# Patient Record
Sex: Male | Born: 1975 | Race: White | Hispanic: No | Marital: Married | Smoking: Never smoker
Health system: Southern US, Community
[De-identification: ages and names within clinical notes are randomized; demographics above are authoritative.]

---

## 2016-03-20 ENCOUNTER — Emergency Department (INDEPENDENT_AMBULATORY_CARE_PROVIDER_SITE_OTHER)
Admission: EM | Admit: 2016-03-20 | Discharge: 2016-03-20 | Disposition: A | Discharge status: Home / Self Care | Payer: BLUE CROSS/BLUE SHIELD | Source: Home / Self Care | Attending: Family Medicine | Admitting: Family Medicine

## 2016-03-20 ENCOUNTER — Encounter: Payer: Self-pay | Admitting: Emergency Medicine

## 2016-03-20 ENCOUNTER — Emergency Department (HOSPITAL_BASED_OUTPATIENT_CLINIC_OR_DEPARTMENT_OTHER): Payer: BLUE CROSS/BLUE SHIELD

## 2016-03-20 ENCOUNTER — Emergency Department (INDEPENDENT_AMBULATORY_CARE_PROVIDER_SITE_OTHER): Payer: BLUE CROSS/BLUE SHIELD

## 2016-03-20 DIAGNOSIS — R053 Chronic cough: Secondary | ICD-10-CM

## 2016-03-20 DIAGNOSIS — R05 Cough: Secondary | ICD-10-CM

## 2016-03-20 DIAGNOSIS — R059 Cough, unspecified: Secondary | ICD-10-CM

## 2016-03-20 MED ORDER — AZITHROMYCIN 250 MG PO TABS: ORAL_TABLET | ORAL | 0 refills | Status: DC

## 2016-03-20 NOTE — ED Triage Notes (Addendum)
SOB, Headache, Slight cough, congestion. Patient moved here from [place] ago, while in [place] he opened 5 paint can which were covered in black mold, he was exposed for a few minutes and believes that is the cause. He started taking garlic and charcoal capsules and as soon as he stopped the symptoms returned last weekend.

## 2016-03-20 NOTE — ED Provider Notes (Signed)
[facility]    CSN: [id number] Arrival date & time: 03/20/16  1422     History   Chief Complaint Chief Complaint  Patient presents with  . Shortness of Breath    HPI Daniel Carlson is a 41 y.o. male.   Patient and family moved here from [place] one month ago.  While there, he remembers opening paint cans that were covered in black mold.  Shortly afterwards he developed sore throat, sinus congestion, and cough.  The cough has persisted, and during the past 5 days he has had increased shortness of breath, headache, and left earache.  No fevers, chills, and sweats.  No wheezing.  No past history of asthma.  He is concerned about his past exposure to black mold.   The history is provided by the patient.    History reviewed. No pertinent past medical history.  There are no active problems to display for this patient.   History reviewed. No pertinent surgical history.     Home Medications    Prior to Admission medications   Medication Sig Start Date End Date Taking? Authorizing Provider  azithromycin (ZITHROMAX Z-PAK) 250 MG tablet Take 2 tabs today; then begin one tab once daily for 4 more days. 03/20/16   Lattie Haw, MD    Family History Family History  Problem Relation Age of Onset  . Cancer Mother   . Hyperlipidemia Mother   . Cancer Father   . Glaucoma Father     Social History Social History  Substance Use Topics  . Smoking status: Never Smoker  . Smokeless tobacco: Never Used  . Alcohol use Yes     Allergies   Patient has no known allergies.   Review of Systems Review of Systems  + sore throat, resolved + cough No pleuritic pain No wheezing + nasal congestion + post-nasal drainage No sinus pain/pressure No itchy/red eyes ? earache No hemoptysis + SOB No fever/chills No nausea No vomiting No abdominal pain No diarrhea No urinary symptoms No skin rash + fatigue No myalgias No headache Used OTC meds without relief      Physical Exam Triage Vital Signs ED Triage Vitals  Enc Vitals Group     BP 03/20/16 1450 128/84     Pulse Rate 03/20/16 1450 62     Resp --      Temp 03/20/16 1450 97.8 F (36.6 C)     Temp Source 03/20/16 1450 Oral     SpO2 03/20/16 1450 99 %     Weight 03/20/16 1453 198 lb (89.8 kg)     Height 03/20/16 1453 6' (1.829 m)     Head Circumference --      Peak Flow --      Pain Score 03/20/16 1455 2     Pain Loc --      Pain Edu? --      Excl. in GC? --    No data found.   Updated Vital Signs BP 128/84 (BP Location: Left Arm)   Pulse 62   Temp 97.8 F (36.6 C) (Oral)   Ht 6' (1.829 m)   Wt 198 lb (89.8 kg)   SpO2 99%   BMI 26.85 kg/m   Visual Acuity Right Eye Distance:   Left Eye Distance:   Bilateral Distance:    Right Eye Near:   Left Eye Near:    Bilateral Near:     Physical Exam Nursing notes and Vital Signs reviewed. Appearance:  Patient appears stated  age, and in no acute distress Eyes:  Pupils are equal, round, and reactive to light and accomodation.  Extraocular movement is intact.  Conjunctivae are not inflamed  Ears:  Canals normal.  Tympanic membranes normal.  Nose:  Mildly congested turbinates.  No sinus tenderness.     Pharynx:  Normal Neck:  Supple.  Tender enlarged posterior/lateral nodes are palpated bilaterally  Lungs:  Clear to auscultation.  Breath sounds are equal.  Moving air well. Heart:  Regular rate and rhythm without murmurs, rubs, or gallops.  Abdomen:  Nontender without masses or hepatosplenomegaly.  Bowel sounds are present.  No CVA or flank tenderness.  Extremities:  No edema.  Skin:  No rash present.    UC Treatments / Results  Labs (all labs ordered are listed, but only abnormal results are displayed) Labs Reviewed - No data to display  EKG  EKG Interpretation None       Radiology Dg Chest 2 View  Result Date: 03/20/2016 CLINICAL DATA:  Persistent cough x1 month EXAM: CHEST  2 VIEW COMPARISON:  None. FINDINGS:  Lungs are clear.  No pleural effusion or pneumothorax. The heart is normal in size. Visualized osseous structures are within normal limits. IMPRESSION: Normal chest radiographs. Electronically Signed   By: Charline BillsSriyesh  Krishnan M.D.   On: 03/20/2016 15:47    Procedures Procedures (including critical care time)  Medications Ordered in UC Medications - No data to display   Initial Impression / Assessment and Plan / UC Course  I have reviewed the triage vital signs and the nursing notes.  Pertinent labs & imaging results that were available during my care of the patient were reviewed by me and considered in my medical decision making (see chart for details).    Negative chest x-ray reassuring. Begin empiric Z-pak for atypical coverage. Take plain guaifenesin (1200mg  extended release tabs such as Mucinex) twice daily, with plenty of water, for cough and congestion.  May add Pseudoephedrine (30mg , one or two every 4 to 6 hours) for sinus congestion.  Get adequate rest.   Stop all antihistamines for now, and other non-prescription cough/cold preparations. May take Delsym Cough Suppressant at bedtime for nighttime cough.  Followup with pulmonologist if not improving after treatment.    Final Clinical Impressions(s) / UC Diagnoses   Final diagnoses:  Persistent cough for 3 weeks or longer    New Prescriptions New Prescriptions   AZITHROMYCIN (ZITHROMAX Z-PAK) 250 MG TABLET    Take 2 tabs today; then begin one tab once daily for 4 more days.     Lattie HawStephen A Cristian Davitt, MD 03/20/16 [phone]

## 2016-03-20 NOTE — Discharge Instructions (Signed)
Take plain guaifenesin (1200mg  extended release tabs such as Mucinex) twice daily, with plenty of water, for cough and congestion.  May add Pseudoephedrine (30mg , one or two every 4 to 6 hours) for sinus congestion.  Get adequate rest.   Stop all antihistamines for now, and other non-prescription cough/cold preparations. May take Delsym Cough Suppressant at bedtime for nighttime cough.

## 2017-03-24 ENCOUNTER — Other Ambulatory Visit: Payer: Self-pay

## 2017-03-24 ENCOUNTER — Emergency Department (INDEPENDENT_AMBULATORY_CARE_PROVIDER_SITE_OTHER)
Admission: EM | Admit: 2017-03-24 | Discharge: 2017-03-24 | Disposition: A | Discharge status: Home / Self Care | Payer: BLUE CROSS/BLUE SHIELD | Source: Home / Self Care | Attending: Family Medicine | Admitting: Family Medicine

## 2017-03-24 ENCOUNTER — Emergency Department (INDEPENDENT_AMBULATORY_CARE_PROVIDER_SITE_OTHER): Payer: BLUE CROSS/BLUE SHIELD

## 2017-03-24 DIAGNOSIS — M542 Cervicalgia: Secondary | ICD-10-CM

## 2017-03-24 DIAGNOSIS — H6192 Disorder of left external ear, unspecified: Secondary | ICD-10-CM

## 2017-03-24 MED ORDER — PREDNISONE 20 MG PO TABS: ORAL_TABLET | ORAL | 0 refills | Status: DC

## 2017-03-24 NOTE — ED Provider Notes (Signed)
[facility]    CSN: [id number] Arrival date & time: 03/24/17  1540     History   Chief Complaint Chief Complaint  Patient presents with  . Neck Pain    HPI Daniel Carlson is a 42 y.o. male.   Patient complains of pain in his left neck and trapezius area for about 10 days.  He recalls that he fell down stairs about a month ago but did not have any neck pain at that time. He also complains of a rough spot on his external right ear that is somewhat painful at times and often crusts over.  The history is provided by the patient.  Neck Injury  The current episode started more than 1 week ago. The problem occurs constantly. The problem has been gradually worsening. Pertinent negatives include no chest pain, no abdominal pain and no headaches. The symptoms are aggravated by walking (rotating neck). Nothing relieves the symptoms. Treatments tried: Ibuprofen and tramadol. The treatment provided mild relief.    History reviewed. No pertinent past medical history.  There are no active problems to display for this patient.   History reviewed. No pertinent surgical history.     Home Medications    Prior to Admission medications   Medication Sig Start Date End Date Taking? Authorizing Provider  predniSONE (DELTASONE) 20 MG tablet Take one tab by mouth twice daily for 4 days, then one daily. Take with food. 03/24/17   Lattie Haw, MD    Family History Family History  Problem Relation Age of Onset  . Cancer Mother   . Hyperlipidemia Mother   . Cancer Father   . Glaucoma Father     Social History Social History   Tobacco Use  . Smoking status: Never Smoker  . Smokeless tobacco: Never Used  Substance Use Topics  . Alcohol use: Yes    Alcohol/week: 0.6 - 1.2 oz    Types: 1 - 2 Cans of beer per week  . Drug use: No     Allergies   Patient has no known allergies.   Review of Systems Review of Systems  Respiratory: Negative.   Cardiovascular:  Negative for chest pain.  Gastrointestinal: Negative for abdominal pain.  Musculoskeletal: Negative for arthralgias.  Skin:       Left ear lesion  Neurological: Negative for headaches.  All other systems reviewed and are negative.    Physical Exam Triage Vital Signs ED Triage Vitals  Enc Vitals Group     BP 03/24/17 1616 129/85     Pulse Rate 03/24/17 1616 63     Resp 03/24/17 1616 18     Temp 03/24/17 1616 98.4 F (36.9 C)     Temp Source 03/24/17 1616 Oral     SpO2 03/24/17 1616 100 %     Weight 03/24/17 1618 205 lb (93 kg)     Height 03/24/17 1618 6' (1.829 m)     Head Circumference --      Peak Flow --      Pain Score 03/24/17 1617 7     Pain Loc --      Pain Edu? --      Excl. in GC? --    No data found.  Updated Vital Signs BP 129/85 (BP Location: Right Arm)   Pulse 63   Temp 98.4 F (36.9 C) (Oral)   Resp 18   Ht 6' (1.829 m)   Wt 205 lb (93 kg)   SpO2 100%   BMI  27.80 kg/m   Visual Acuity Right Eye Distance:   Left Eye Distance:   Bilateral Distance:    Right Eye Near:   Left Eye Near:    Bilateral Near:     Physical Exam  Constitutional: He appears well-developed and well-nourished. No distress.  HENT:  Head: Normocephalic.  Right Ear: External ear normal.  Ears:  Nose: Nose normal.  Mouth/Throat: Oropharynx is clear and moist.  Left superior helix has has tenderness as noted on diagram.  Minimal erythema but no induration of fluctuance.  Eyes: Pupils are equal, round, and reactive to light.  Neck: Normal range of motion. Neck supple. No muscular tenderness present.    Pain in left neck and trapezius area as noted on diagram, without tenderness to palpation. No adenopathy  Cardiovascular: Normal heart sounds.  Pulmonary/Chest: Breath sounds normal.  Abdominal: Bowel sounds are normal. There is no tenderness.  Musculoskeletal: He exhibits no edema.  Skin: Skin is warm and dry. No rash noted.  Nursing note and vitals reviewed.    UC  Treatments / Results  Labs (all labs ordered are listed, but only abnormal results are displayed) Labs Reviewed - No data to display  EKG  EKG Interpretation None       Radiology Dg Cervical Spine Complete  Result Date: 03/24/2017 CLINICAL DATA:  LEFT neck pain and trapezius area pain for 10 days EXAM: CERVICAL SPINE - COMPLETE 4+ VIEW COMPARISON:  None FINDINGS: Prevertebral soft tissues normal thickness. Osseous mineralization normal. Vertebral body and disc space heights maintained. No acute fracture, subluxation, or bone destruction. Lung apices clear. Bony foramina grossly patent. C1-C2 alignment normal. IMPRESSION: No acute osseous abnormalities. Electronically Signed   By: Ulyses SouthwardMark  Boles M.D.   On: 03/24/2017 18:02    Procedures Procedures (including critical care time)  Medications Ordered in UC Medications - No data to display   Initial Impression / Assessment and Plan / UC Course  I have reviewed the triage vital signs and the nursing notes.  Pertinent labs & imaging results that were available during my care of the patient were reviewed by me and considered in my medical decision making (see chart for details).      ? Cervical radiculopathy. Begin prednisone burst/taper. Suspect lesion left ear is actinic keratosis. Followup with Dr. Rodney Langtonhomas Thekkekandam or Dr. Clementeen GrahamEvan Corey ([facility]) for further evaluation    Final Clinical Impressions(s) / UC Diagnoses   Final diagnoses:  Neck pain    ED Discharge Orders        Ordered    predniSONE (DELTASONE) 20 MG tablet     03/24/17 1831         Lattie HawBeese, Chellie Vanlue A, MD 03/27/17 1438

## 2017-03-24 NOTE — Discharge Instructions (Signed)
Apply ice pack for 20 to 30 minutes, 3 to 4 times daily  Continue until pain and swelling decrease.  May take Tylenol as needed for pain. 

## 2017-03-24 NOTE — ED Triage Notes (Signed)
Pt c/o radiating neck pain x 1 week. Had a fall down some steps 3 weeks ago but isn't sure if that is related. Had not tried any OTC meds.

## 2017-06-01 ENCOUNTER — Emergency Department (INDEPENDENT_AMBULATORY_CARE_PROVIDER_SITE_OTHER): Payer: BLUE CROSS/BLUE SHIELD

## 2017-06-01 ENCOUNTER — Encounter: Payer: Self-pay | Admitting: *Deleted

## 2017-06-01 ENCOUNTER — Emergency Department (INDEPENDENT_AMBULATORY_CARE_PROVIDER_SITE_OTHER)
Admission: EM | Admit: 2017-06-01 | Discharge: 2017-06-01 | Disposition: A | Discharge status: Home / Self Care | Payer: BLUE CROSS/BLUE SHIELD | Source: Home / Self Care | Attending: Emergency Medicine | Admitting: Emergency Medicine

## 2017-06-01 ENCOUNTER — Other Ambulatory Visit: Payer: Self-pay

## 2017-06-01 DIAGNOSIS — R109 Unspecified abdominal pain: Secondary | ICD-10-CM

## 2017-06-01 DIAGNOSIS — K5900 Constipation, unspecified: Secondary | ICD-10-CM

## 2017-06-01 LAB — POCT CBC W AUTO DIFF (K'VILLE URGENT CARE)

## 2017-06-01 LAB — POCT URINALYSIS DIP (MANUAL ENTRY)
Bilirubin, UA: NEGATIVE
Glucose, UA: NEGATIVE mg/dL
Ketones, POC UA: NEGATIVE mg/dL
Leukocytes, UA: NEGATIVE
NITRITE UA: NEGATIVE
PROTEIN UA: NEGATIVE mg/dL
RBC UA: NEGATIVE
SPEC GRAV UA: 1.02 (ref 1.010–1.025)
UROBILINOGEN UA: 0.2 U/dL
pH, UA: 5.5 (ref 5.0–8.0)

## 2017-06-01 NOTE — Discharge Instructions (Addendum)
Take a dose of MiraLAX tonight and in the morning. If you develop fever or worsening abdominal pain please present to the emergency room or return to the clinic here. Try a modified bland diet.

## 2017-06-01 NOTE — ED Triage Notes (Addendum)
Pt c/o lower mid abd pain x 1 wk. Denies N/V/D or fever. BM's normal. He has taken Gas X with minimal relief.

## 2017-06-01 NOTE — ED Provider Notes (Signed)
[facility]    CSN: [id number] Arrival date & time: 06/01/17  1502     History   Chief Complaint Chief Complaint  Patient presents with  . Abdominal Pain    HPI Daniel Carlson is a 42 y.o. male.   HPI Patient had the onset 1 week ago of low abdominal pain.  This seems to be in the suprapubic area and more to the left.  He has had a decrease in bowel movements and decrease in the quantity of his bowel movements.  He denies any urinary symptoms.  He has had no nausea or vomiting and a good appetite.  He denies fever.  Of note he had to drive to  for travel and sometimes has difficulty with traveling and constipation.  The pain is a dull ache in his lower abdomen.  He has no previous history of bowel problems.  He has a negative family history and has never had a colonoscopy. History reviewed. No pertinent past medical history.  There are no active problems to display for this patient.   History reviewed. No pertinent surgical history.     Home Medications    Prior to Admission medications   Not on File    Family History Family History  Problem Relation Age of Onset  . Cancer Mother   . Hyperlipidemia Mother   . Cancer Father   . Glaucoma Father     Social History Social History   Tobacco Use  . Smoking status: Never Smoker  . Smokeless tobacco: Never Used  Substance Use Topics  . Alcohol use: Yes    Alcohol/week: 0.6 - 1.2 oz    Types: 1 - 2 Cans of beer per week  . Drug use: No     Allergies   Patient has no known allergies.   Review of Systems Review of Systems  Constitutional: Negative.   HENT: Negative.   Respiratory: Negative.   Cardiovascular: Negative.   Gastrointestinal: Positive for abdominal pain and constipation. Negative for blood in stool, diarrhea, nausea and vomiting.  Genitourinary: Negative.      Physical Exam Triage Vital Signs ED Triage Vitals  Enc Vitals Group     BP 06/01/17 1518 135/81     Pulse  Rate 06/01/17 1518 64     Resp 06/01/17 1518 16     Temp 06/01/17 1518 98.1 F (36.7 C)     Temp Source 06/01/17 1518 Oral     SpO2 06/01/17 1518 97 %     Weight 06/01/17 1519 200 lb (90.7 kg)     Height 06/01/17 1519 6' (1.829 m)     Head Circumference --      Peak Flow --      Pain Score 06/01/17 1518 3     Pain Loc --      Pain Edu? --      Excl. in GC? --    No data found.  Updated Vital Signs BP 135/81 (BP Location: Right Arm)   Pulse 64   Temp 98.1 F (36.7 C) (Oral)   Resp 16   Ht 6' (1.829 m)   Wt 200 lb (90.7 kg)   SpO2 97%   BMI 27.12 kg/m   Visual Acuity Right Eye Distance:   Left Eye Distance:   Bilateral Distance:    Right Eye Near:   Left Eye Near:    Bilateral Near:     Physical Exam  Constitutional: He appears well-developed and well-nourished.  Eyes: No scleral  icterus.  Cardiovascular: Normal rate and normal heart sounds.  Pulmonary/Chest: Effort normal and breath sounds normal.  Abdominal: There is no CVA tenderness.  The abdomen is flat.  Bowel sounds are normal.  There is mild discomfort in the suprapubic and deep left lower abdominal area.  There is no rebound.  He is able to hop on one foot with no discomfort.  Genitourinary:  Genitourinary Comments: Genital evaluation reveals no hernia to be felt the testicles are normal.     UC Treatments / Results  Labs (all labs ordered are listed, but only abnormal results are displayed) Labs Reviewed  COMPLETE METABOLIC PANEL WITH GFR  AMYLASE  LIPASE  POCT CBC W AUTO DIFF ([facility])  POCT URINALYSIS DIP (MANUAL ENTRY)    EKG None  Radiology Dg Abdomen Acute W/chest  Result Date: 06/01/2017 CLINICAL DATA:  Abdominal pain EXAM: DG ABDOMEN ACUTE W/ 1V CHEST COMPARISON:  Chest 03/20/2016 FINDINGS: There is no evidence of dilated bowel loops or free intraperitoneal air. No radiopaque calculi or other significant radiographic abnormality is seen. Heart size and mediastinal contours  are within normal limits. Both lungs are clear. IMPRESSION: Negative abdominal radiographs.  No acute cardiopulmonary disease. Electronically Signed   By: Marlan Palau M.D.   On: 06/01/2017 16:00    Procedures Procedures (including critical care time)  Medications Ordered in UC Medications - No data to display  Initial Impression / Assessment and Plan / UC Course  I have reviewed the triage vital signs and the nursing notes.  Pertinent labs & imaging results that were available during my care of the patient were reviewed by me and considered in my medical decision making (see chart for details). CBC is normal with a white count of 7700 59% segs with a hemoglobin 15.3 platelet 308,000.  Urine dip is negative.  Acute abdominal series is completely normal.  We will treat as constipation.  He was given red flags to return to clinic if development of fever or worsening abdominal pain.  In the absence of fever or an elevated white count or more significant tenderness I did not feel a CT was indicated.  He knows to return to clinic if any worsening symptoms develop for further evaluation.  Labs were done and pending.     Final Clinical Impressions(s) / UC Diagnoses   Final diagnoses:  Abdominal pain, unspecified abdominal location  Constipation, unspecified constipation type     Discharge Instructions     Take a dose of MiraLAX tonight and in the morning. If you develop fever or worsening abdominal pain please present to the emergency room or return to the clinic here. Try a modified bland diet.    ED Prescriptions    None     Controlled Substance Prescriptions  Controlled Substance Registry consulted? Not Applicable   Collene Gobble, MD 06/01/17 [phone]

## 2017-06-02 ENCOUNTER — Telehealth: Payer: Self-pay | Admitting: Emergency Medicine

## 2017-06-02 LAB — COMPLETE METABOLIC PANEL WITH GFR
AG RATIO: 1.8 (calc) (ref 1.0–2.5)
ALBUMIN MSPROF: 4.6 g/dL (ref 3.6–5.1)
ALKALINE PHOSPHATASE (APISO): 60 U/L (ref 40–115)
ALT: 15 U/L (ref 9–46)
AST: 18 U/L (ref 10–40)
BILIRUBIN TOTAL: 0.7 mg/dL (ref 0.2–1.2)
BUN: 13 mg/dL (ref 7–25)
CHLORIDE: 103 mmol/L (ref 98–110)
CO2: 29 mmol/L (ref 20–32)
Calcium: 10.1 mg/dL (ref 8.6–10.3)
Creat: 1.18 mg/dL (ref 0.60–1.35)
GFR, EST AFRICAN AMERICAN: 87 mL/min/{1.73_m2} (ref >=60)
GFR, Est Non African American: 75 mL/min/{1.73_m2} (ref >=60)
GLOBULIN: 2.5 g/dL (ref 1.9–3.7)
GLUCOSE: 69 mg/dL (ref 65–99)
Potassium: 4.2 mmol/L (ref 3.5–5.3)
SODIUM: 140 mmol/L (ref 135–146)
TOTAL PROTEIN: 7.1 g/dL (ref 6.1–8.1)

## 2017-06-02 LAB — AMYLASE: Amylase: 45 U/L (ref 21–101)

## 2017-06-02 LAB — LIPASE: Lipase: 19 U/L (ref 7–60)

## 2019-03-30 ENCOUNTER — Ambulatory Visit: Payer: BLUE CROSS/BLUE SHIELD | Attending: Internal Medicine

## 2019-03-30 DIAGNOSIS — Z23 Encounter for immunization: Secondary | ICD-10-CM

## 2019-03-30 NOTE — Progress Notes (Signed)
   Covid-19 Vaccination Clinic  Name:  Daniel Carlson    MRN: [id number] DOB: 1975-05-07  03/30/2019  [name] was observed post Covid-19 immunization for 15 minutes without incident. He was provided with Vaccine Information Sheet and instruction to access the V-Safe system.   [name] was instructed to call 911 with any severe reactions post vaccine: Marland Kitchen Difficulty breathing  . Swelling of face and throat  . A fast heartbeat  . A bad rash all over body  . Dizziness and weakness   Immunizations Administered    Name Date Dose VIS Date Route   Pfizer COVID-19 Vaccine 03/30/2019 12:50 PM 0.3 mL 12/16/2018 Intramuscular   Manufacturer: [employer], [employer]   Lot: [id number]   NDC: [address]-8

## 2019-04-26 ENCOUNTER — Ambulatory Visit: Payer: BLUE CROSS/BLUE SHIELD | Attending: Internal Medicine

## 2019-04-26 DIAGNOSIS — Z23 Encounter for immunization: Secondary | ICD-10-CM

## 2019-04-26 NOTE — Progress Notes (Signed)
   Covid-19 Vaccination Clinic  Name:  Duriel Deery    MRN: [id number] DOB: 03/20/1975  04/26/2019  Mr. Bilal was observed post Covid-19 immunization for 15 minutes without incident. He was provided with Vaccine Information Sheet and instruction to access the V-Safe system.   Mr. Santibanez was instructed to call 911 with any severe reactions post vaccine: Marland Kitchen Difficulty breathing  . Swelling of face and throat  . A fast heartbeat  . A bad rash all over body  . Dizziness and weakness   Immunizations Administered    Name Date Dose VIS Date Route   Pfizer COVID-19 Vaccine 04/26/2019  1:00 PM 0.3 mL 03/01/2018 Intramuscular   Manufacturer: [employer], [employer]   Lot: [id number]   NDC: [address]-1

## 2019-12-07 IMAGING — DX DG ABDOMEN ACUTE W/ 1V CHEST
4 series · 4 of 4 positions shown · non-contrast
Comparison: Chest 03/20/2016

CLINICAL DATA: Abdominal pain

EXAM:
DG ABDOMEN ACUTE W/ 1V CHEST

[chest pa]
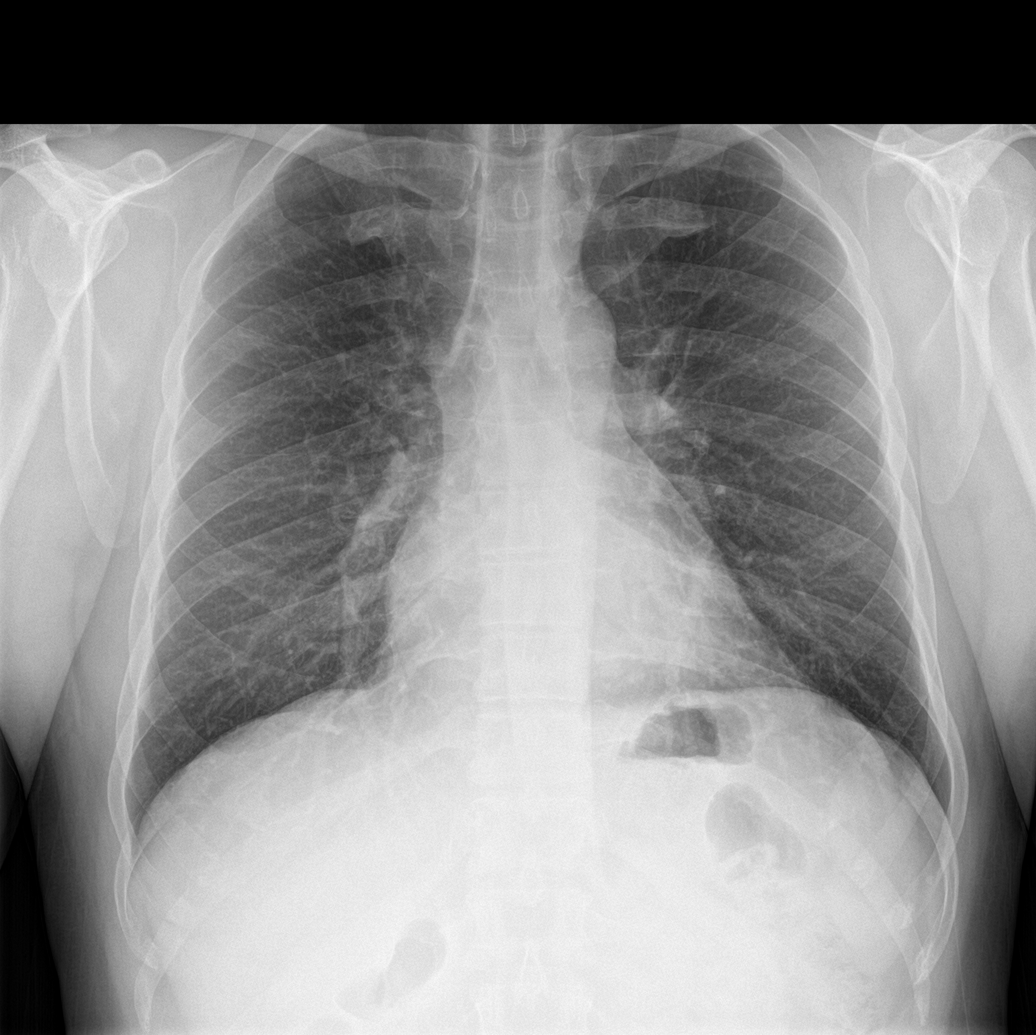

[abdomen erect]
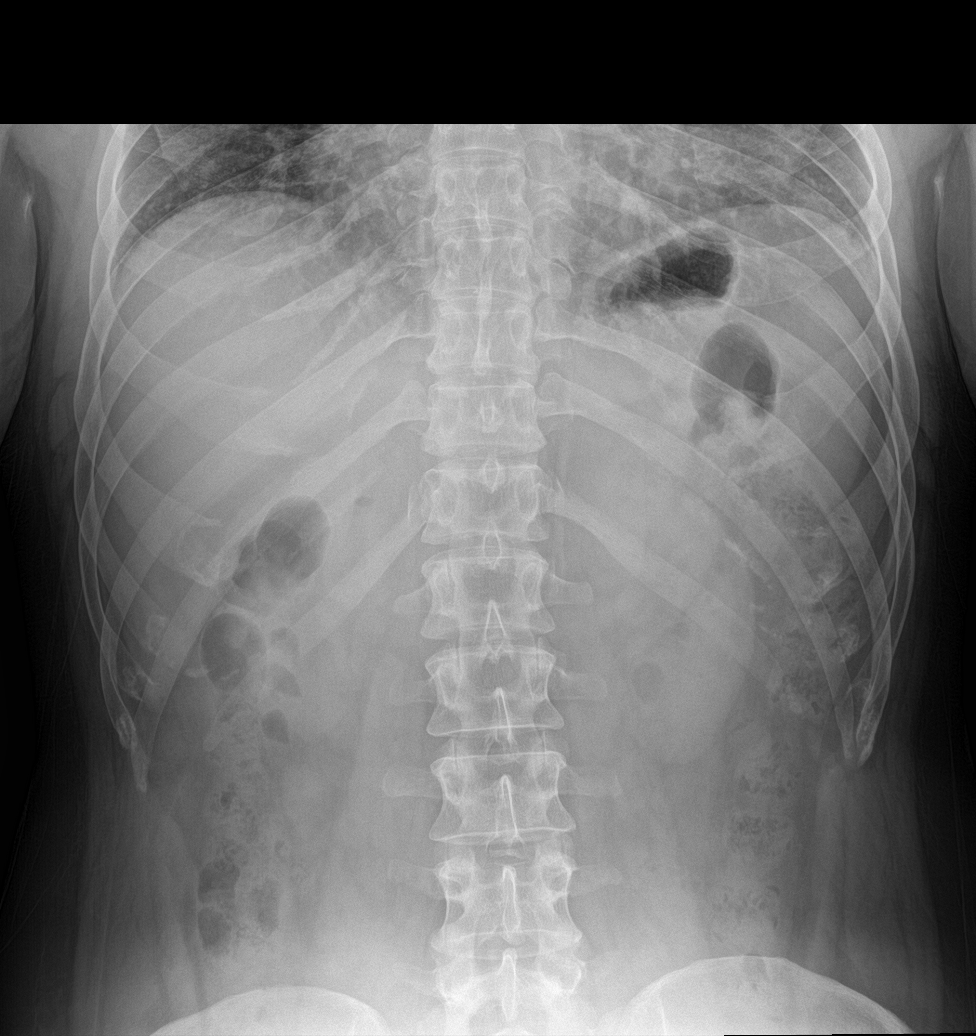

[abdomen supine (1 of 2)]
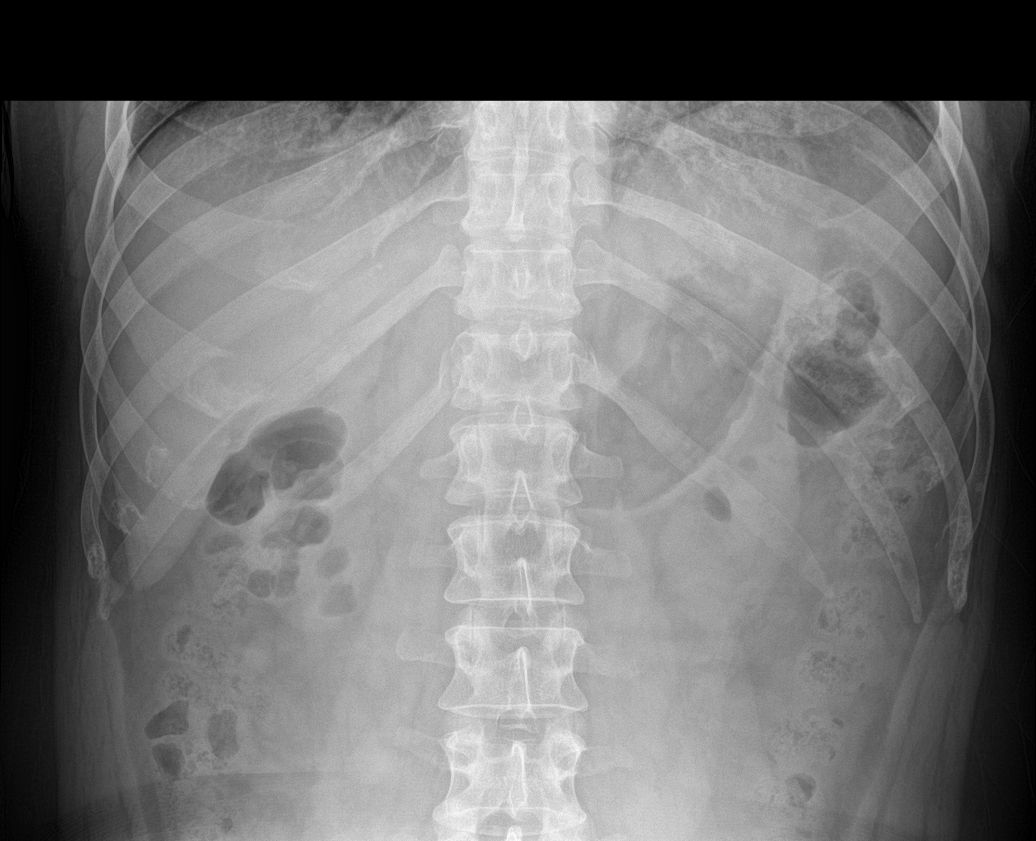

[abdomen supine (2 of 2)]
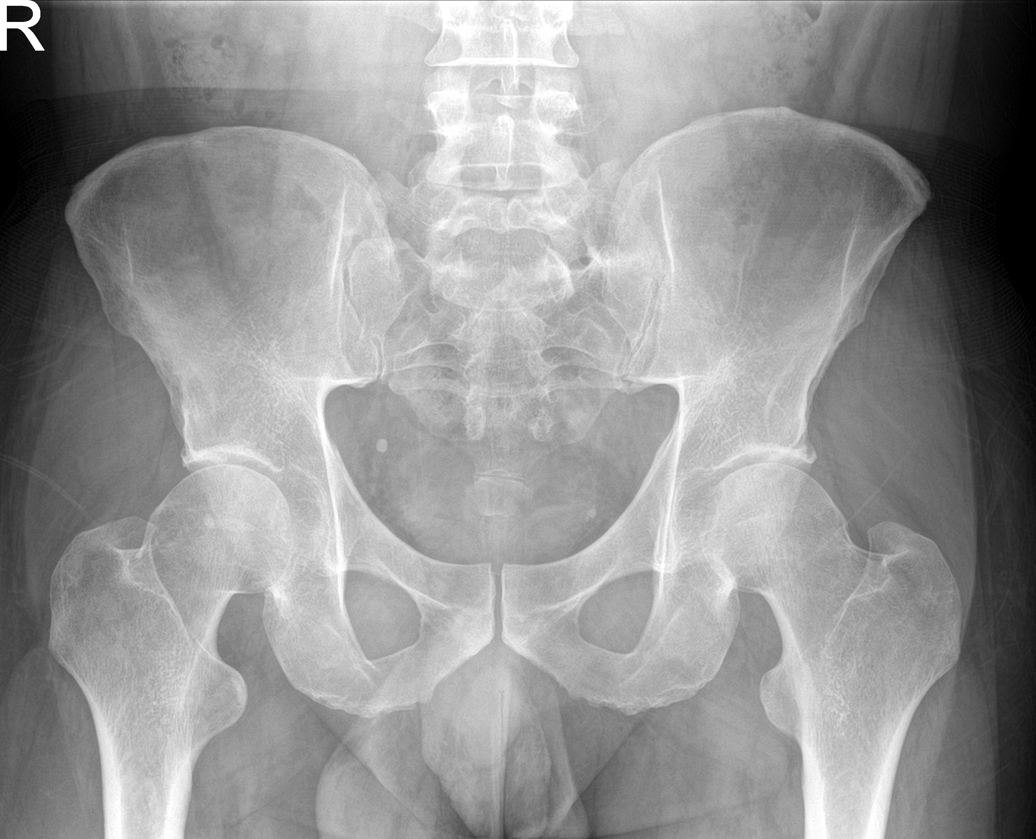

[4 of 4 positions shown; findings below may reference images not displayed]

FINDINGS: There is no evidence of dilated bowel loops or free intraperitoneal
air. No radiopaque calculi or other significant radiographic
abnormality is seen. Heart size and mediastinal contours are within
normal limits. Both lungs are clear.
IMPRESSION: Negative abdominal radiographs.  No acute cardiopulmonary disease.

## 2023-12-17 ENCOUNTER — Ambulatory Visit
Admission: EM | Admit: 2023-12-17 | Discharge: 2023-12-17 | Disposition: A | Discharge status: Home / Self Care | Attending: Physician Assistant | Admitting: Physician Assistant

## 2023-12-17 ENCOUNTER — Other Ambulatory Visit: Payer: Self-pay

## 2023-12-17 DIAGNOSIS — R220 Localized swelling, mass and lump, head: Secondary | ICD-10-CM

## 2023-12-17 MED ORDER — METHYLPREDNISOLONE SODIUM SUCC 40 MG IJ SOLR
40.0000 mg | Freq: Once | INTRAMUSCULAR | Status: AC
  Administered 2023-12-17: 40 mg via INTRAMUSCULAR

## 2023-12-17 NOTE — ED Provider Notes (Signed)
 [facility]    CSN: [id number] Arrival date & time: 12/17/23  1316      History   Chief Complaint Chief Complaint  Patient presents with   Oral Swelling    HPI Alcide Memoli is a 48 y.o. male. History reviewed. No pertinent past medical history.   HPI  Discussed the use of AI scribe software for clinical note transcription with the patient, who gave verbal consent to proceed.  The patient is a 48 year old who presents with recurrent lip swelling.  He has been experiencing episodes of lip swelling since October, occurring every one to two weeks, typically around the weekend. The swelling affects different areas of the lips, including the upper and lower lips, and sometimes involves the entire lip. Episodes usually resolve within one to four hours, but the most recent episode persisted overnight and was still present the following morning, prompting this visit.  The swelling is described as itchy, similar to the sensation of hitting the lip, and can be exacerbated by touching or irritating the area, such as when brushing teeth. No specific trigger has been identified, but he mentions starting a keto diet prior to the onset of symptoms. He consumes low-carb meals and occasionally uses keto wraps or bread, but does not consume keto desserts. He drinks Coke Zero daily, which contains artificial sweeteners, but has not noticed a direct correlation with the swelling.  He has taken over-the-counter allergy medication, which is non-drowsy, and Benadryl during episodes, but is unsure of his effectiveness. 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 No associated symptoms such as rashes, tongue swelling, or respiratory issues are present. He has not observed any bumps or lesions on his lips during the swelling episodes.   History reviewed. No pertinent past medical history.  There are no active problems to display for this  patient.   History reviewed. No pertinent surgical history.     Home Medications    Prior to Admission medications  Not on File    Family History Family History  Problem Relation Age of Onset   Cancer Mother    Hyperlipidemia Mother    Cancer Father    Glaucoma Father     Social History Social History[1]   Allergies   Cat dander and Dust mite extract   Review of Systems Review of Systems  Constitutional:  Negative for chills and fever.  HENT:  Positive for facial swelling. Negative for trouble swallowing.   Respiratory:  Negative for cough, shortness of breath and wheezing.   Cardiovascular:  Negative for chest pain and palpitations.     Physical Exam Triage Vital Signs ED Triage Vitals  Encounter Vitals Group     BP 12/17/23 1403 121/66     Girls Systolic BP Percentile --      Girls Diastolic BP Percentile --      Boys Systolic BP Percentile --      Boys Diastolic BP Percentile --      Pulse Rate 12/17/23 1403 81     Resp 12/17/23 1403 16     Temp 12/17/23 1403 98.1 F (36.7 C)     Temp src --      SpO2 12/17/23 1403 98 %     Weight 12/17/23 1404 183 lb 1.6 oz (83.1 kg)     Height 12/17/23 1404 6' (1.829 m)     Head Circumference --  Peak Flow --      Pain Score 12/17/23 1404 0     Pain Loc --      Pain Education --      Exclude from Growth Chart --    No data found.  Updated Vital Signs BP 121/66   Pulse 81   Temp 98.1 F (36.7 C)   Resp 16   Ht 6' (1.829 m)   Wt 183 lb 1.6 oz (83.1 kg)   SpO2 98%   BMI 24.83 kg/m   Visual Acuity Right Eye Distance:   Left Eye Distance:   Bilateral Distance:    Right Eye Near:   Left Eye Near:    Bilateral Near:     Physical Exam Vitals reviewed.  Constitutional:      General: He is awake.     Appearance: Normal appearance. He is well-developed and well-groomed.  HENT:     Head: Normocephalic and atraumatic.     Mouth/Throat:     Lips: Pink. No lesions.     Mouth: Mucous membranes  are moist. No oral lesions or angioedema.     Dentition: Normal dentition. No dental tenderness, dental caries or gum lesions.     Tongue: No lesions.     Pharynx: Oropharynx is clear. Uvula midline.     Tonsils: No tonsillar exudate or tonsillar abscesses. 0 on the right. 0 on the left.     Comments: Upper lip is not look severely swollen.  Patient does have evidence of small, papular lesions along the upper lips which could indicate Fordyce spots or potential disseminated herpetic development.  Patient denies pain, no lesions on the lips at this time. Eyes:     Extraocular Movements: Extraocular movements intact.     Conjunctiva/sclera: Conjunctivae normal.  Pulmonary:     Effort: Pulmonary effort is normal.  Musculoskeletal:     Cervical back: Normal range of motion.  Neurological:     Mental Status: He is alert and oriented to person, place, and time.  Psychiatric:        Attention and Perception: Attention normal.        Mood and Affect: Mood normal.        Speech: Speech normal.        Behavior: Behavior normal. Behavior is cooperative.      UC Treatments / Results  Labs (all labs ordered are listed, but only abnormal results are displayed) Labs Reviewed - No data to display  EKG   Radiology No results found.  Procedures Procedures (including critical care time)  Medications Ordered in UC Medications  methylPREDNISolone sodium succinate (SOLU-MEDROL) 40 mg/mL injection 40 mg (40 mg Intramuscular Given 12/17/23 1453)    Initial Impression / Assessment and Plan / UC Course  I have reviewed the triage vital signs and the nursing notes.  Pertinent labs & imaging results that were available during my care of the patient were reviewed by me and considered in my medical decision making (see chart for details).      Final Clinical Impressions(s) / UC Diagnoses   Final diagnoses:  Swelling of upper lip   Recurrent angioedema of the lip Intermittent swelling of  the lip since October, occurring every 1-2 weeks, often on weekends. Swelling lasts about 4 hours but has been prolonged recently. No associated rashes, itching, or respiratory symptoms. Possible triggers include artificial sweeteners or specific foods, though no clear correlation identified. Differential includes allergic reaction or other unknown trigger. - Administered steroid injection to  reduce swelling. - Start antihistamine such as Claritin or Zyrtec. - Maintain a symptom journal to identify potential triggers. - Take pictures of the lip during and without swelling for comparison. - Establish care with a primary care physician for further evaluation and potential referral to allergy specialist. - Go to the ER for severe swelling or trouble breathing as this may indicate a severe reaction   General Health Maintenance Discussion about colon cancer screening options. He is 48 years old and has not yet undergone a colonoscopy. Cologuard test discussed as a non-invasive alternative to colonoscopy, with the option for colonoscopy if results are positive. - Consider Cologuard test for colon cancer screening. - Establish care with a primary care physician for routine health maintenance and screenings.    Discharge Instructions      VISIT SUMMARY:  You came in today because of recurrent swelling of your lips, which has been happening every one to two weeks since October. The swelling usually lasts a few hours but recently lasted overnight, prompting this visit. You have not identified a specific trigger, but you mentioned starting a keto diet and consuming artificial sweeteners. You have tried over-the-counter allergy medications with uncertain effectiveness.  YOUR PLAN:  -RECURRENT ANGIOEDEMA OF THE LIP: Recurrent angioedema is a condition where there is intermittent swelling of the lips, possibly due to an allergic reaction or other unknown triggers. Today, you received a steroid injection to  reduce the swelling. You should start taking an antihistamine like Claritin or Zyrtec. Keep a symptom journal to help identify potential triggers and take pictures of your lips during and without swelling for comparison. It's important to establish care with a primary care physician for further evaluation and possibly see an allergy specialist.  -GENERAL HEALTH MAINTENANCE: We discussed colon cancer screening options since you are 48 years old and have not yet had a colonoscopy. A Cologuard test, which is a non-invasive alternative to a colonoscopy, was suggested. If the Cologuard test results are positive, a colonoscopy will be needed. Establishing care with a primary care physician for routine health maintenance and screenings is also recommended.  INSTRUCTIONS:  Please follow up with a primary care physician for further evaluation of your lip swelling and to discuss the possibility of seeing an allergy specialist. Additionally, consider undergoing a Cologuard test for colon cancer screening and establish care with a primary care physician for routine health maintenance and screenings. If you start to develop severe swelling of the lips, swelling of the tongue, difficulty swallowing, trouble breathing, rapid heart rate, nausea or vomiting or diarrhea please go to the emergency room as these could be signs of a severe allergic reaction.    ED Prescriptions   None    PDMP not reviewed this encounter.    [1]  Social History Tobacco Use   Smoking status: Never   Smokeless tobacco: Never  Vaping Use   Vaping status: Never Used  Substance Use Topics   Alcohol use: Yes    Alcohol/week: 1.0 - 2.0 standard drink of alcohol    Types: 1 - 2 Cans of beer per week   Drug use: No     Trampas Stettner, Rocky BRAVO, PA-C 12/17/23 1502

## 2023-12-17 NOTE — ED Triage Notes (Addendum)
 Has had lip swelling x months. happens intermittently. Reports this time is to upper lip which started last night. No airway involvement. Took benadryl. No fever. Doing keto and is wondering if this may be d/t an artificial sweetener since this started after he started keto.

## 2023-12-17 NOTE — Discharge Instructions (Addendum)
 VISIT SUMMARY:  You came in today because of recurrent swelling of your lips, which has been happening every one to two weeks since October. The swelling usually lasts a few hours but recently lasted overnight, prompting this visit. You have not identified a specific trigger, but you mentioned starting a keto diet and consuming artificial sweeteners. You have tried over-the-counter allergy medications with uncertain effectiveness.  YOUR PLAN:  -RECURRENT ANGIOEDEMA OF THE LIP: Recurrent angioedema is a condition where there is intermittent swelling of the lips, possibly due to an allergic reaction or other unknown triggers. Today, you received a steroid injection to reduce the swelling. You should start taking an antihistamine like Claritin or Zyrtec. Keep a symptom journal to help identify potential triggers and take pictures of your lips during and without swelling for comparison. It's important to establish care with a primary care physician for further evaluation and possibly see an allergy specialist.  -GENERAL HEALTH MAINTENANCE: We discussed colon cancer screening options since you are 48 years old and have not yet had a colonoscopy. A Cologuard test, which is a non-invasive alternative to a colonoscopy, was suggested. If the Cologuard test results are positive, a colonoscopy will be needed. Establishing care with a primary care physician for routine health maintenance and screenings is also recommended.  INSTRUCTIONS:  Please follow up with a primary care physician for further evaluation of your lip swelling and to discuss the possibility of seeing an allergy specialist. Additionally, consider undergoing a Cologuard test for colon cancer screening and establish care with a primary care physician for routine health maintenance and screenings. If you start to develop severe swelling of the lips, swelling of the tongue, difficulty swallowing, trouble breathing, rapid heart rate, nausea or vomiting  or diarrhea please go to the emergency room as these could be signs of a severe allergic reaction.
